# Patient Record
Sex: Male | Born: 2000 | Race: White | Hispanic: No | Marital: Single | Smoking: Never smoker
Health system: Southern US, Community
[De-identification: ages and names within clinical notes are randomized; demographics above are authoritative.]

## PROBLEM LIST (undated history)

## (undated) DIAGNOSIS — J45909 Unspecified asthma, uncomplicated: Secondary | ICD-10-CM

## (undated) HISTORY — PX: OTHER SURGICAL HISTORY: SHX169

---
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
## 2023-12-06 ENCOUNTER — Other Ambulatory Visit: Payer: Self-pay | Admitting: Pediatrics

## 2023-12-06 ENCOUNTER — Ambulatory Visit
Admission: RE | Admit: 2023-12-06 | Discharge: 2023-12-06 | Disposition: A | Discharge status: Home / Self Care | Payer: Managed Care, Other (non HMO) | Source: Ambulatory Visit | Attending: Pediatrics | Admitting: Pediatrics

## 2023-12-06 ENCOUNTER — Ambulatory Visit
Admission: RE | Admit: 2023-12-06 | Discharge: 2023-12-06 | Disposition: A | Discharge status: Home / Self Care | Payer: Managed Care, Other (non HMO) | Attending: Pediatrics | Admitting: Pediatrics

## 2023-12-06 DIAGNOSIS — M545 Low back pain, unspecified: Secondary | ICD-10-CM
